# Patient Record
Sex: Female | Born: 1965 | Race: White | Hispanic: No | Marital: Single | Smoking: Never smoker
Health system: Southern US, Community
[De-identification: ages and names within clinical notes are randomized; demographics above are authoritative.]

## PROBLEM LIST (undated history)

## (undated) DIAGNOSIS — N920 Excessive and frequent menstruation with regular cycle: Secondary | ICD-10-CM

## (undated) DIAGNOSIS — L409 Psoriasis, unspecified: Secondary | ICD-10-CM

## (undated) DIAGNOSIS — I340 Nonrheumatic mitral (valve) insufficiency: Secondary | ICD-10-CM

## (undated) DIAGNOSIS — G4700 Insomnia, unspecified: Secondary | ICD-10-CM

## (undated) DIAGNOSIS — I519 Heart disease, unspecified: Secondary | ICD-10-CM

## (undated) DIAGNOSIS — B069 Rubella without complication: Secondary | ICD-10-CM

## (undated) DIAGNOSIS — R1310 Dysphagia, unspecified: Secondary | ICD-10-CM

## (undated) DIAGNOSIS — I341 Nonrheumatic mitral (valve) prolapse: Secondary | ICD-10-CM

## (undated) DIAGNOSIS — R569 Unspecified convulsions: Secondary | ICD-10-CM

## (undated) DIAGNOSIS — Q02 Microcephaly: Secondary | ICD-10-CM

## (undated) DIAGNOSIS — K5909 Other constipation: Secondary | ICD-10-CM

## (undated) DIAGNOSIS — E079 Disorder of thyroid, unspecified: Secondary | ICD-10-CM

## (undated) DIAGNOSIS — F73 Profound intellectual disabilities: Secondary | ICD-10-CM

## (undated) DIAGNOSIS — G8101 Flaccid hemiplegia affecting right dominant side: Secondary | ICD-10-CM

## (undated) DIAGNOSIS — L709 Acne, unspecified: Secondary | ICD-10-CM

## (undated) HISTORY — PX: THYROID SURGERY: SHX805

---

## 1999-09-19 ENCOUNTER — Emergency Department (HOSPITAL_COMMUNITY): Admission: EM | Admit: 1999-09-19 | Discharge: 1999-09-20 | Payer: Self-pay | Admitting: Emergency Medicine

## 1999-09-19 ENCOUNTER — Ambulatory Visit (HOSPITAL_COMMUNITY): Admission: RE | Admit: 1999-09-19 | Discharge: 1999-09-19 | Payer: Self-pay | Admitting: Family Medicine

## 1999-09-19 ENCOUNTER — Encounter: Payer: Self-pay | Admitting: Family Medicine

## 1999-10-23 ENCOUNTER — Ambulatory Visit (HOSPITAL_COMMUNITY): Admission: RE | Admit: 1999-10-23 | Discharge: 1999-10-23 | Payer: Self-pay | Admitting: Family Medicine

## 1999-10-23 ENCOUNTER — Encounter: Payer: Self-pay | Admitting: Family Medicine

## 2001-02-04 ENCOUNTER — Encounter: Admission: RE | Admit: 2001-02-04 | Discharge: 2001-05-05 | Payer: Self-pay | Admitting: Family Medicine

## 2001-07-30 ENCOUNTER — Encounter: Payer: Self-pay | Admitting: Emergency Medicine

## 2001-07-30 ENCOUNTER — Emergency Department (HOSPITAL_COMMUNITY): Admission: EM | Admit: 2001-07-30 | Discharge: 2001-07-30 | Payer: Self-pay | Admitting: Emergency Medicine

## 2001-10-23 ENCOUNTER — Observation Stay (HOSPITAL_COMMUNITY): Admission: EM | Admit: 2001-10-23 | Discharge: 2001-10-25 | Payer: Self-pay | Admitting: Emergency Medicine

## 2002-02-06 ENCOUNTER — Encounter: Payer: Self-pay | Admitting: Specialist

## 2002-02-06 ENCOUNTER — Ambulatory Visit (HOSPITAL_COMMUNITY): Admission: RE | Admit: 2002-02-06 | Discharge: 2002-02-06 | Payer: Self-pay | Admitting: Specialist

## 2002-07-07 ENCOUNTER — Emergency Department (HOSPITAL_COMMUNITY): Admission: EM | Admit: 2002-07-07 | Discharge: 2002-07-07 | Payer: Self-pay | Admitting: Emergency Medicine

## 2003-03-04 ENCOUNTER — Emergency Department (HOSPITAL_COMMUNITY): Admission: EM | Admit: 2003-03-04 | Discharge: 2003-03-04 | Payer: Self-pay | Admitting: Emergency Medicine

## 2004-02-17 ENCOUNTER — Emergency Department (HOSPITAL_COMMUNITY): Admission: EM | Admit: 2004-02-17 | Discharge: 2004-02-17 | Payer: Self-pay | Admitting: Emergency Medicine

## 2004-05-03 ENCOUNTER — Emergency Department (HOSPITAL_COMMUNITY): Admission: EM | Admit: 2004-05-03 | Discharge: 2004-05-03 | Payer: Self-pay | Admitting: Emergency Medicine

## 2004-06-08 ENCOUNTER — Emergency Department (HOSPITAL_COMMUNITY): Admission: EM | Admit: 2004-06-08 | Discharge: 2004-06-08 | Payer: Self-pay | Admitting: Emergency Medicine

## 2004-06-20 ENCOUNTER — Encounter (INDEPENDENT_AMBULATORY_CARE_PROVIDER_SITE_OTHER): Payer: Self-pay | Admitting: Specialist

## 2004-06-20 ENCOUNTER — Other Ambulatory Visit: Admission: RE | Admit: 2004-06-20 | Discharge: 2004-06-20 | Payer: Self-pay | Admitting: Interventional Radiology

## 2004-06-20 ENCOUNTER — Encounter: Admission: RE | Admit: 2004-06-20 | Discharge: 2004-06-20 | Payer: Self-pay | Admitting: Specialist

## 2004-07-17 ENCOUNTER — Emergency Department (HOSPITAL_COMMUNITY): Admission: EM | Admit: 2004-07-17 | Discharge: 2004-07-18 | Payer: Self-pay | Admitting: Emergency Medicine

## 2004-08-01 ENCOUNTER — Ambulatory Visit (HOSPITAL_COMMUNITY): Admission: RE | Admit: 2004-08-01 | Discharge: 2004-08-01 | Payer: Self-pay | Admitting: Specialist

## 2004-08-01 ENCOUNTER — Encounter (INDEPENDENT_AMBULATORY_CARE_PROVIDER_SITE_OTHER): Payer: Self-pay | Admitting: Cardiology

## 2004-08-03 ENCOUNTER — Ambulatory Visit (HOSPITAL_COMMUNITY): Admission: RE | Admit: 2004-08-03 | Discharge: 2004-08-03 | Payer: Self-pay | Admitting: Otolaryngology

## 2005-08-30 ENCOUNTER — Emergency Department (HOSPITAL_COMMUNITY): Admission: EM | Admit: 2005-08-30 | Discharge: 2005-08-30 | Payer: Self-pay | Admitting: Emergency Medicine

## 2007-06-05 ENCOUNTER — Ambulatory Visit (HOSPITAL_COMMUNITY): Admission: RE | Admit: 2007-06-05 | Discharge: 2007-06-05 | Payer: Self-pay | Admitting: Gastroenterology

## 2007-10-22 ENCOUNTER — Ambulatory Visit: Payer: Self-pay | Admitting: Obstetrics and Gynecology

## 2007-11-10 ENCOUNTER — Emergency Department (HOSPITAL_BASED_OUTPATIENT_CLINIC_OR_DEPARTMENT_OTHER): Admission: EM | Admit: 2007-11-10 | Discharge: 2007-11-10 | Payer: Self-pay | Admitting: Emergency Medicine

## 2008-06-15 ENCOUNTER — Emergency Department (HOSPITAL_BASED_OUTPATIENT_CLINIC_OR_DEPARTMENT_OTHER): Admission: EM | Admit: 2008-06-15 | Discharge: 2008-06-15 | Payer: Self-pay | Admitting: Emergency Medicine

## 2009-02-14 ENCOUNTER — Emergency Department (HOSPITAL_BASED_OUTPATIENT_CLINIC_OR_DEPARTMENT_OTHER): Admission: EM | Admit: 2009-02-14 | Discharge: 2009-02-14 | Payer: Self-pay | Admitting: Emergency Medicine

## 2009-05-22 ENCOUNTER — Emergency Department (HOSPITAL_BASED_OUTPATIENT_CLINIC_OR_DEPARTMENT_OTHER): Admission: EM | Admit: 2009-05-22 | Discharge: 2009-05-22 | Payer: Self-pay | Admitting: Emergency Medicine

## 2009-05-22 ENCOUNTER — Ambulatory Visit: Payer: Self-pay | Admitting: Diagnostic Radiology

## 2010-05-07 ENCOUNTER — Encounter: Payer: Self-pay | Admitting: Specialist

## 2010-05-07 ENCOUNTER — Encounter: Payer: Self-pay | Admitting: Gastroenterology

## 2010-05-09 ENCOUNTER — Emergency Department (HOSPITAL_BASED_OUTPATIENT_CLINIC_OR_DEPARTMENT_OTHER)
Admission: EM | Admit: 2010-05-09 | Discharge: 2010-05-09 | Payer: Self-pay | Source: Home / Self Care | Admitting: Emergency Medicine

## 2010-05-15 ENCOUNTER — Emergency Department (HOSPITAL_BASED_OUTPATIENT_CLINIC_OR_DEPARTMENT_OTHER)
Admission: EM | Admit: 2010-05-15 | Discharge: 2010-05-15 | Payer: Self-pay | Source: Home / Self Care | Admitting: Emergency Medicine

## 2010-07-19 LAB — URINALYSIS, ROUTINE W REFLEX MICROSCOPIC
Bilirubin Urine: NEGATIVE
Glucose, UA: NEGATIVE mg/dL
Hgb urine dipstick: NEGATIVE
Nitrite: NEGATIVE
Specific Gravity, Urine: 1.014 (ref 1.005–1.030)
Urobilinogen, UA: 0.2 mg/dL (ref 0.0–1.0)

## 2010-07-19 LAB — URINE CULTURE
Colony Count: NO GROWTH
Culture: NO GROWTH

## 2010-07-19 LAB — BASIC METABOLIC PANEL
Calcium: 9.5 mg/dL (ref 8.4–10.5)
GFR calc Af Amer: >60 mL/min (ref >60)
Sodium: 143 mEq/L (ref 135–145)

## 2010-07-19 LAB — POCT TOXICOLOGY PANEL

## 2010-07-19 LAB — ACETAMINOPHEN LEVEL: Acetaminophen (Tylenol), Serum: <10 ug/mL — ABNORMAL LOW (ref 10–30)

## 2010-07-19 LAB — DIFFERENTIAL
Basophils Relative: 0 % (ref 0–1)
Lymphs Abs: 1.2 10*3/uL (ref 0.7–4.0)
Monocytes Absolute: 0.6 10*3/uL (ref 0.1–1.0)
Monocytes Relative: 10 % (ref 3–12)
Neutro Abs: 3.5 10*3/uL (ref 1.7–7.7)

## 2010-07-19 LAB — CBC
Hemoglobin: 15.9 g/dL — ABNORMAL HIGH (ref 12.0–15.0)
MCHC: 34.6 g/dL (ref 30.0–36.0)
MCV: 94.4 fL (ref 78.0–100.0)
RBC: 4.88 MIL/uL (ref 3.87–5.11)
WBC: 5.3 10*3/uL (ref 4.0–10.5)

## 2010-07-27 LAB — DIFFERENTIAL
Basophils Relative: 1 % (ref 0–1)
Eosinophils Relative: 3 % (ref 0–5)
Lymphs Abs: 3 10*3/uL (ref 0.7–4.0)
Monocytes Absolute: 1 10*3/uL (ref 0.1–1.0)
Monocytes Relative: 14 % — ABNORMAL HIGH (ref 3–12)
Neutro Abs: 3.4 10*3/uL (ref 1.7–7.7)
Neutrophils Relative %: 43 % (ref 43–77)

## 2010-07-27 LAB — CBC
HCT: 43.2 % (ref 36.0–46.0)
MCV: 94.8 fL (ref 78.0–100.0)
Platelets: 353 10*3/uL (ref 150–400)
WBC: 7.6 10*3/uL (ref 4.0–10.5)

## 2010-07-27 LAB — BASIC METABOLIC PANEL
CO2: 28 mEq/L (ref 19–32)
Calcium: 8.7 mg/dL (ref 8.4–10.5)
Chloride: 107 mEq/L (ref 96–112)
GFR calc Af Amer: >60 mL/min (ref >60)
Glucose, Bld: 72 mg/dL (ref 70–99)
Potassium: 3.9 mEq/L (ref 3.5–5.1)

## 2010-08-04 ENCOUNTER — Emergency Department (HOSPITAL_BASED_OUTPATIENT_CLINIC_OR_DEPARTMENT_OTHER)
Admission: EM | Admit: 2010-08-04 | Discharge: 2010-08-04 | Disposition: A | Discharge status: Home / Self Care | Payer: Medicare Other | Attending: Emergency Medicine | Admitting: Emergency Medicine

## 2010-08-04 DIAGNOSIS — H109 Unspecified conjunctivitis: Secondary | ICD-10-CM | POA: Insufficient documentation

## 2010-08-04 DIAGNOSIS — Z79899 Other long term (current) drug therapy: Secondary | ICD-10-CM | POA: Insufficient documentation

## 2010-08-04 DIAGNOSIS — H5789 Other specified disorders of eye and adnexa: Secondary | ICD-10-CM | POA: Insufficient documentation

## 2011-10-07 ENCOUNTER — Encounter (HOSPITAL_BASED_OUTPATIENT_CLINIC_OR_DEPARTMENT_OTHER): Payer: Self-pay | Admitting: *Deleted

## 2011-10-07 ENCOUNTER — Emergency Department (HOSPITAL_BASED_OUTPATIENT_CLINIC_OR_DEPARTMENT_OTHER)
Admission: EM | Admit: 2011-10-07 | Discharge: 2011-10-07 | Disposition: A | Discharge status: Home / Self Care | Payer: Medicare Other | Attending: Emergency Medicine | Admitting: Emergency Medicine

## 2011-10-07 DIAGNOSIS — G40401 Other generalized epilepsy and epileptic syndromes, not intractable, with status epilepticus: Secondary | ICD-10-CM | POA: Insufficient documentation

## 2011-10-07 DIAGNOSIS — Z79899 Other long term (current) drug therapy: Secondary | ICD-10-CM | POA: Insufficient documentation

## 2011-10-07 DIAGNOSIS — G40901 Epilepsy, unspecified, not intractable, with status epilepticus: Secondary | ICD-10-CM

## 2011-10-07 DIAGNOSIS — F79 Unspecified intellectual disabilities: Secondary | ICD-10-CM | POA: Insufficient documentation

## 2011-10-07 DIAGNOSIS — Z8673 Personal history of transient ischemic attack (TIA), and cerebral infarction without residual deficits: Secondary | ICD-10-CM | POA: Insufficient documentation

## 2011-10-07 HISTORY — DX: Unspecified convulsions: R56.9

## 2011-10-07 HISTORY — DX: Profound intellectual disabilities: F73

## 2011-10-07 HISTORY — DX: Disorder of thyroid, unspecified: E07.9

## 2011-10-07 HISTORY — DX: Heart disease, unspecified: I51.9

## 2011-10-07 LAB — URINALYSIS, ROUTINE W REFLEX MICROSCOPIC
Glucose, UA: NEGATIVE mg/dL
Hgb urine dipstick: NEGATIVE
Leukocytes, UA: NEGATIVE
Protein, ur: NEGATIVE mg/dL
Specific Gravity, Urine: 1.016 (ref 1.005–1.030)
Urobilinogen, UA: 0.2 mg/dL (ref 0.0–1.0)

## 2011-10-07 LAB — BASIC METABOLIC PANEL
BUN: 16 mg/dL (ref 6–23)
Calcium: 9.2 mg/dL (ref 8.4–10.5)
Chloride: 97 mEq/L (ref 96–112)
GFR calc Af Amer: >90 mL/min (ref >90)
GFR calc non Af Amer: >90 mL/min (ref >90)
Glucose, Bld: 111 mg/dL — ABNORMAL HIGH (ref 70–99)

## 2011-10-07 LAB — GLUCOSE, CAPILLARY: Glucose-Capillary: 53 mg/dL — ABNORMAL LOW (ref 70–99)

## 2011-10-07 LAB — DIFFERENTIAL
Basophils Relative: 0 % (ref 0–1)
Eosinophils Absolute: 0.1 10*3/uL (ref 0.0–0.7)
Lymphocytes Relative: 17 % (ref 12–46)
Monocytes Absolute: 1 10*3/uL (ref 0.1–1.0)
Neutro Abs: 8.7 10*3/uL — ABNORMAL HIGH (ref 1.7–7.7)
Neutrophils Relative %: 74 % (ref 43–77)

## 2011-10-07 LAB — CBC
Hemoglobin: 13.2 g/dL (ref 12.0–15.0)
MCH: 30.9 pg (ref 26.0–34.0)
MCHC: 34.5 g/dL (ref 30.0–36.0)
RDW: 14.2 % (ref 11.5–15.5)
WBC: 11.7 10*3/uL — ABNORMAL HIGH (ref 4.0–10.5)

## 2011-10-07 MED ORDER — DEXTROSE 50 % IV SOLN
1.0000 | Freq: Once | INTRAVENOUS | Status: AC
  Administered 2011-10-07: 50 mL via INTRAVENOUS
  Filled 2011-10-07: qty 50

## 2011-10-07 MED ORDER — SODIUM CHLORIDE 0.9 % IV BOLUS (SEPSIS)
1000.0000 mL | Freq: Once | INTRAVENOUS | Status: AC
  Administered 2011-10-07: 1000 mL via INTRAVENOUS

## 2011-10-07 NOTE — ED Notes (Signed)
Pt to ed #4 via ems from group home with seizures x3 (grand mal) since this am 2 focal seizures witnessed . 2 .5 versed via para

## 2011-10-07 NOTE — ED Provider Notes (Addendum)
History     CSN: [id number]  Arrival date & time 10/07/11  0935   First MD Initiated Contact with Patient 10/07/11 [phone]      No chief complaint on file.   (Consider location/radiation/quality/duration/timing/severity/associated sxs/prior treatment) HPI - Level 5 caveat due to mental status Pt brought to the ED via EMS from local group home. She has a history of mental retardation, seizures and stroke. She has had a sore throat recently and has been intermittently refusing her medications including antiepileptics. She had several seizures this morning, although this is not unusual for her. She was given valium enroute by EMS and is somnolent on arrival.   No past medical history on file.  No past surgical history on file.  No family history on file.  History  Substance Use Topics  . Smoking status: Not on file  . Smokeless tobacco: Not on file  . Alcohol Use: Not on file    OB History    No data available      Review of Systems Unable to assess due to mental status.   Allergies  Review of patient's allergies indicates not on file.  Home Medications   Current Outpatient Rx  Name Route Sig Dispense Refill  . ZYRTEC PO Oral Take by mouth.    . CLONAZEPAM PO Oral Take by mouth.    . DECADRON PO Oral Take by mouth.    Marland Kitchen VALIUM PO Oral Take by mouth.    Marland Kitchen FLUTICASONE PROPIONATE 50 MCG/ACT NA SUSP      . FOLIC ACID PO Oral Take by mouth.    Marland Kitchen LAMICTAL PO Oral Take by mouth.    . ATIVAN PO Oral Take by mouth.    . METHOTREXATE PO Oral Take by mouth.    . NORTREL 7/7/7 PO Oral Take by mouth.    . AMBIEN PO Oral Take by mouth.      BP 100/75  Pulse 83  Resp 18  SpO2 96%  Physical Exam  Nursing note and vitals reviewed. Constitutional: She appears well-nourished.  HENT:  Head: Normocephalic and atraumatic.  Eyes: Pupils are equal, round, and reactive to light. Right eye exhibits no discharge. Left eye exhibits no discharge.  Neck: Normal range of motion. Neck  supple.  Cardiovascular: Normal rate, regular rhythm and intact distal pulses.   Murmur heard. Pulmonary/Chest: Effort normal and breath sounds normal. She has no wheezes. She has no rales.  Abdominal: Bowel sounds are normal. She exhibits no distension. There is no tenderness.  Musculoskeletal: Normal range of motion. She exhibits no edema.  Neurological:       Unable to assess due to mental status  Skin: Skin is warm and dry. No rash noted.  Psychiatric:       Unable to assess due to mental status    ED Course  Procedures (including critical care time)  Labs Reviewed  CBC - Abnormal; Notable for the following:    WBC 11.7 (*)     Platelets 419 (*)     All other components within normal limits  DIFFERENTIAL - Abnormal; Notable for the following:    Neutro Abs 8.7 (*)     All other components within normal limits  BASIC METABOLIC PANEL - Abnormal; Notable for the following:    Glucose, Bld 111 (*)     All other components within normal limits  URINALYSIS, ROUTINE W REFLEX MICROSCOPIC - Abnormal; Notable for the following:    APPearance CLOUDY (*)  All other components within normal limits  GLUCOSE, CAPILLARY - Abnormal; Notable for the following:    Glucose-Capillary 53 (*)     All other components within normal limits  PREGNANCY, URINE  URINE CULTURE   No results found.   No diagnosis found.    MDM  Care giver at bedside states she is usually able to get the patient to take her medications but that other staff are less successful. Pt had borderline low CBG on arrival given IV D50 with improvement. Caregiver is comfortable with the patient returning to Group Home.      Date: 10/07/2011  Rate: 75  Rhythm: normal sinus rhythm  QRS Axis: right  Intervals: normal  ST/T Wave abnormalities: normal  Conduction Disutrbances:iRBBB  Narrative Interpretation:   Old EKG Reviewed: unchanged   12:10 PM Caregiver now states the nurse from Group Home would like for me  to discuss this case with their supervising provider who is supposed to call me back soon. Discussed with Dr. August Saucer who is the patient's neurologist and would like for the patient to be admitted to her service at [facility]. Awaiting bed assignment.   Monterius Rolf B. Bernette Mayers, MD 10/07/11 1610

## 2011-10-07 NOTE — ED Notes (Signed)
Glucose 53 by accucheck

## 2011-10-07 NOTE — ED Notes (Signed)
Arterial puncture for lab draw performed to left radial artery.  Positive Allen's test prior to draw.  Pressure for 5 minutes.

## 2011-10-09 LAB — URINE CULTURE: Culture: NO GROWTH

## 2012-04-29 ENCOUNTER — Ambulatory Visit: Payer: Medicare Other | Admitting: Rehabilitation

## 2012-04-30 ENCOUNTER — Ambulatory Visit: Payer: Medicare Other | Attending: Specialist | Admitting: Rehabilitation

## 2012-04-30 DIAGNOSIS — IMO0001 Reserved for inherently not codable concepts without codable children: Secondary | ICD-10-CM | POA: Insufficient documentation

## 2012-04-30 DIAGNOSIS — R262 Difficulty in walking, not elsewhere classified: Secondary | ICD-10-CM | POA: Insufficient documentation

## 2013-06-12 ENCOUNTER — Ambulatory Visit: Payer: Medicare Other

## 2013-06-15 ENCOUNTER — Ambulatory Visit: Payer: Medicare Other | Attending: Family Medicine | Admitting: Physical Therapy

## 2013-06-15 DIAGNOSIS — IMO0001 Reserved for inherently not codable concepts without codable children: Secondary | ICD-10-CM | POA: Insufficient documentation

## 2013-06-15 DIAGNOSIS — G8190 Hemiplegia, unspecified affecting unspecified side: Secondary | ICD-10-CM | POA: Insufficient documentation

## 2013-06-15 DIAGNOSIS — R262 Difficulty in walking, not elsewhere classified: Secondary | ICD-10-CM | POA: Insufficient documentation

## 2013-06-23 ENCOUNTER — Ambulatory Visit: Payer: Medicare Other | Admitting: Physical Therapy

## 2013-07-11 ENCOUNTER — Emergency Department (HOSPITAL_BASED_OUTPATIENT_CLINIC_OR_DEPARTMENT_OTHER)
Admission: EM | Admit: 2013-07-11 | Discharge: 2013-07-11 | Disposition: A | Discharge status: Home / Self Care | Payer: Medicare Other | Attending: Emergency Medicine | Admitting: Emergency Medicine

## 2013-07-11 ENCOUNTER — Encounter (HOSPITAL_BASED_OUTPATIENT_CLINIC_OR_DEPARTMENT_OTHER): Payer: Self-pay | Admitting: Emergency Medicine

## 2013-07-11 DIAGNOSIS — Z043 Encounter for examination and observation following other accident: Secondary | ICD-10-CM | POA: Insufficient documentation

## 2013-07-11 DIAGNOSIS — W19XXXA Unspecified fall, initial encounter: Secondary | ICD-10-CM

## 2013-07-11 DIAGNOSIS — G40909 Epilepsy, unspecified, not intractable, without status epilepticus: Secondary | ICD-10-CM | POA: Insufficient documentation

## 2013-07-11 DIAGNOSIS — IMO0002 Reserved for concepts with insufficient information to code with codable children: Secondary | ICD-10-CM | POA: Insufficient documentation

## 2013-07-11 DIAGNOSIS — I519 Heart disease, unspecified: Secondary | ICD-10-CM | POA: Insufficient documentation

## 2013-07-11 DIAGNOSIS — Y9210 Unspecified residential institution as the place of occurrence of the external cause: Secondary | ICD-10-CM | POA: Insufficient documentation

## 2013-07-11 DIAGNOSIS — Z8619 Personal history of other infectious and parasitic diseases: Secondary | ICD-10-CM | POA: Insufficient documentation

## 2013-07-11 DIAGNOSIS — F73 Profound intellectual disabilities: Secondary | ICD-10-CM | POA: Insufficient documentation

## 2013-07-11 DIAGNOSIS — Z79899 Other long term (current) drug therapy: Secondary | ICD-10-CM | POA: Insufficient documentation

## 2013-07-11 DIAGNOSIS — Y9389 Activity, other specified: Secondary | ICD-10-CM | POA: Insufficient documentation

## 2013-07-11 DIAGNOSIS — W06XXXA Fall from bed, initial encounter: Secondary | ICD-10-CM | POA: Insufficient documentation

## 2013-07-11 NOTE — ED Notes (Signed)
Per guardian patient has the ability to indicate pain when touched if she is hurting, no abrasions

## 2013-07-11 NOTE — ED Notes (Signed)
Per Child psychotherapistsocial worker, patient was found on the floor beside her bed and had rolled out of bed.

## 2013-07-11 NOTE — ED Provider Notes (Signed)
CSN: [id number]     Arrival date & time 07/11/13  2536 History   First MD Initiated Contact with Patient 07/11/13 [phone]     Chief Complaint  Patient presents with  . Fall     (Consider location/radiation/quality/duration/timing/severity/associated sxs/prior Treatment) Patient is a 48 y.o. female presenting with fall. The history is provided by a caregiver.  Fall   patient here after having an unwitnessed fall at the group home. 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 She fell from a bed approximately 3 feet above the floor onto a carpeted surface. According to the group home staff member, she is at her neurological baseline. Patient is able to communicate when she is in pain and staff member says that she has not done this. She is at her baseline state of health currently.  Past Medical History  Diagnosis Date  . Seizures   . Thyroid disease   . Profoundly mentally retarded   . Heart disease     rubella   Past Surgical History  Procedure Laterality Date  . Thyroid surgery     No family history on file. History  Substance Use Topics  . Smoking status: Never Smoker   . Smokeless tobacco: Not on file  . Alcohol Use: No   OB History   Grav Para Term Preterm Abortions TAB SAB Ect Mult Living                 Review of Systems  All other systems reviewed and are negative.      Allergies  Review of patient's allergies indicates no known allergies.  Home Medications   Current Outpatient Rx  Name  Route  Sig  Dispense  Refill  . cetirizine (ZYRTEC) 10 MG tablet   Oral   Take 10 mg by mouth daily. Patient is given this medication for her allergies.         . clindamycin (CLEOCIN) 300 MG capsule   Oral   Take 300 mg by mouth 3 (three) times daily.         . clobetasol cream (TEMOVATE) 0.05 %   Topical   Apply 1 application topically 2 (two) times daily.         . clonazePAM (KLONOPIN) 2 MG tablet    Oral   Take 2 mg by mouth 2 (two) times daily as needed.         Marland Kitchen dexamethasone (DECADRON) 4 MG tablet   Oral   Take 4 mg by mouth 2 (two) times daily with a meal.         . folic acid (FOLVITE) 1 MG tablet   Oral   Take 1 mg by mouth daily.         Marland Kitchen lamoTRIgine (LAMICTAL) 25 MG tablet   Oral   Take 25 mg by mouth daily. Anti-seizure medication.  Consult needed.         . levETIRAcetam (KEPPRA) 750 MG tablet   Oral   Take 750 mg by mouth every 12 (twelve) hours. Patient is given 1500 milligrams of this medication at 8 am and 8 pm.         . levothyroxine (SYNTHROID, LEVOTHROID) 125 MCG tablet   Oral   Take 125 mcg by mouth daily.         . methotrexate (RHEUMATREX) 2.5 MG tablet   Oral   Take 2.5 mg by mouth once a week. Caution:Chemotherapy. Protect from light. Patient  is given this medication on Tuesdays.         . methotrexate (RHEUMATREX) 7.5 MG tablet   Oral   Take 7.5 mg by mouth once a week. Caution" Chemotherapy. Protect from light. Patient is given this medication on Mondays.         . norethindrone-ethinyl estradiol (TRIPHASIL,CYCLAFEM,ALYACEN) 0.5/0.75/1-35 MG-MCG tablet   Oral   Take 1 tablet by mouth daily.         . polyethylene glycol (MIRALAX / GLYCOLAX) packet   Oral   Take 17 g by mouth daily. Patient is given this medication for constipation.         . potassium chloride SA (K-DUR,KLOR-CON) 20 MEQ tablet   Oral   Take 20 mEq by mouth 2 (two) times daily.         Marland Kitchen. zolpidem (AMBIEN) 10 MG tablet   Oral   Take 10 mg by mouth at bedtime as needed.          BP 90/55  Pulse 60  Temp(Src) 98.4 F (36.9 C) (Oral)  Resp 24  SpO2 100% Physical Exam  Nursing note and vitals reviewed. Constitutional: She appears well-developed and well-nourished.  Non-toxic appearance. No distress.  HENT:  Head: Normocephalic and atraumatic.  Eyes: Conjunctivae, EOM and lids are normal. Pupils are equal, round, and reactive to light.  Neck:  Normal range of motion. Neck supple. No tracheal deviation present. No mass present.  Cardiovascular: Normal rate, regular rhythm and normal heart sounds.  Exam reveals no gallop.   No murmur heard. Pulmonary/Chest: Effort normal and breath sounds normal. No stridor. No respiratory distress. She has no decreased breath sounds. She has no wheezes. She has no rhonchi. She has no rales.  Abdominal: Soft. Normal appearance and bowel sounds are normal. She exhibits no distension. There is no tenderness. There is no rebound and no CVA tenderness.  Musculoskeletal: Normal range of motion. She exhibits no edema and no tenderness.  Neurological: She is alert. No cranial nerve deficit. GCS eye subscore is 4. GCS verbal subscore is 5. GCS motor subscore is 6.  No changes noted from baseline according to staff members  Skin: Skin is warm and dry. No abrasion and no rash noted.  Psychiatric: Her affect is blunt. She is slowed.  All behavior at baseline according to staff members    ED Course  Procedures (including critical care time) Labs Review Labs Reviewed - No data to display Imaging Review No results found.   EKG Interpretation None      MDM   Final diagnoses:  None    Patient without visible signs of trauma at this time. Have palpated her extremities as well as her chest and abdomen and she is nontender. She's also nontender along her cervical thoracic and lumbar spine. Blood pressure noted and likely her baseline given her condition. No indication for imaging at this time. Case discussed with group home staff member and patient stable for discharge    Toy BakerAnthony T Raoul Ciano, MD 07/11/13 0800

## 2013-07-11 NOTE — Discharge Instructions (Signed)

## 2016-03-15 ENCOUNTER — Encounter (HOSPITAL_COMMUNITY): Payer: Self-pay | Admitting: Vascular Surgery

## 2016-03-15 ENCOUNTER — Emergency Department (HOSPITAL_COMMUNITY)
Admission: EM | Admit: 2016-03-15 | Discharge: 2016-03-15 | Disposition: A | Discharge status: Home / Self Care | Payer: Medicare Other | Attending: Emergency Medicine | Admitting: Emergency Medicine

## 2016-03-15 DIAGNOSIS — R1110 Vomiting, unspecified: Secondary | ICD-10-CM | POA: Insufficient documentation

## 2016-03-15 HISTORY — DX: Flaccid hemiplegia affecting right dominant side: G81.01

## 2016-03-15 HISTORY — DX: Excessive and frequent menstruation with regular cycle: N92.0

## 2016-03-15 HISTORY — DX: Unspecified convulsions: R56.9

## 2016-03-15 HISTORY — DX: Rubella without complication: B06.9

## 2016-03-15 HISTORY — DX: Nonrheumatic mitral (valve) insufficiency: I34.0

## 2016-03-15 HISTORY — DX: Insomnia, unspecified: G47.00

## 2016-03-15 HISTORY — DX: Acne, unspecified: L70.9

## 2016-03-15 HISTORY — DX: Dysphagia, unspecified: R13.10

## 2016-03-15 HISTORY — DX: Other constipation: K59.09

## 2016-03-15 HISTORY — DX: Microcephaly: Q02

## 2016-03-15 HISTORY — DX: Nonrheumatic mitral (valve) prolapse: I34.1

## 2016-03-15 HISTORY — DX: Psoriasis, unspecified: L40.9

## 2016-03-15 MED ORDER — PROMETHAZINE HCL 25 MG PO TABS
25.0000 mg | ORAL_TABLET | Freq: Once | ORAL | Status: AC
  Administered 2016-03-15: 25 mg via ORAL
  Filled 2016-03-15: qty 1

## 2016-03-15 MED ORDER — PROMETHAZINE HCL 25 MG PO TABS: 25.0000 mg | ORAL_TABLET | Freq: Three times a day (TID) | ORAL | 0 refills | Status: AC | PRN

## 2016-03-15 NOTE — ED Triage Notes (Signed)
Pt reports to the ED for eval of N/V that began today. Pt was seen at [facility] on Monday for multiple seizures. She had lab work done and was given ativan and had her medications increased and today she developed N/V possibly attributed to medication rxn? Caregiver requesting that blood results be obtained from [facility] instead of having her be stuck again.

## 2016-03-15 NOTE — ED Provider Notes (Signed)
[facility]-EMERGENCY DEPT Provider Note   CSN: [id number] Arrival date & time: 03/15/16  1735     History   Chief Complaint Chief Complaint  Patient presents with  . Emesis    HPI Megan Rasmussen is a 50 y.o. female.  Level V caveat for severe mental retardation secondary to microcephaly. History obtained from caregiver. Patient probably had 3-4 episodes of vomitus today. Patient was seen at [facility] on Monday for increased seizure frequency.  Apparently her Vimpat was increased at that time, but the caregiver is uncertain of the dose.  No fever, sweats, chills, changes in behavior      Past Medical History:  Diagnosis Date  . Acne   . Chronic constipation   . Dysphagia   . Heart disease    rubella  . Insomnia   . Menorrhagia   . Microcephalic (HCC)   . Mitral prolapse   . Mitral regurgitation   . Profoundly mentally retarded   . Psoriasis   . Right flaccid hemiplegia (HCC)   . Rubella   . Seizure (HCC)   . Seizures (HCC)   . Thyroid disease     There are no active problems to display for this patient.   Past Surgical History:  Procedure Laterality Date  . THYROID SURGERY      OB History    No data available       Home Medications    Prior to Admission medications   Medication Sig Start Date End Date Taking? Authorizing Provider  cetirizine (ZYRTEC) 10 MG tablet Take 10 mg by mouth daily. Patient is given this medication for her allergies.    Historical Provider, MD  clindamycin (CLEOCIN) 300 MG capsule Take 300 mg by mouth 3 (three) times daily.    Historical Provider, MD  clobetasol cream (TEMOVATE) 0.05 % Apply 1 application topically 2 (two) times daily.    Historical Provider, MD  clonazePAM (KLONOPIN) 2 MG tablet Take 2 mg by mouth 2 (two) times daily as needed.    Historical Provider, MD  dexamethasone (DECADRON) 4 MG tablet Take 4 mg by mouth 2 (two) times daily with a meal.    Historical Provider, MD  folic acid (FOLVITE) 1  MG tablet Take 1 mg by mouth daily.    Historical Provider, MD  lamoTRIgine (LAMICTAL) 25 MG tablet Take 25 mg by mouth daily. Anti-seizure medication.  Consult needed.    Historical Provider, MD  levETIRAcetam (KEPPRA) 750 MG tablet Take 750 mg by mouth every 12 (twelve) hours. Patient is given 1500 milligrams of this medication at 8 am and 8 pm.    Historical Provider, MD  levothyroxine (SYNTHROID, LEVOTHROID) 125 MCG tablet Take 125 mcg by mouth daily.    Historical Provider, MD  methotrexate (RHEUMATREX) 2.5 MG tablet Take 2.5 mg by mouth once a week. Caution:Chemotherapy. Protect from light. Patient is given this medication on Tuesdays.    Historical Provider, MD  methotrexate (RHEUMATREX) 7.5 MG tablet Take 7.5 mg by mouth once a week. Caution" Chemotherapy. Protect from light. Patient is given this medication on Mondays.    Historical Provider, MD  norethindrone-ethinyl estradiol (TRIPHASIL,CYCLAFEM,ALYACEN) 0.5/0.75/1-35 MG-MCG tablet Take 1 tablet by mouth daily.    Historical Provider, MD  polyethylene glycol (MIRALAX / GLYCOLAX) packet Take 17 g by mouth daily. Patient is given this medication for constipation.    Historical Provider, MD  potassium chloride SA (K-DUR,KLOR-CON) 20 MEQ tablet Take 20 mEq by mouth 2 (two) times daily.  Historical Provider, MD  promethazine (PHENERGAN) 25 MG tablet Take 1 tablet (25 mg total) by mouth every 8 (eight) hours as needed for nausea or vomiting. 03/15/16   Donnetta HutchingBrian Dove Gresham, MD  zolpidem (AMBIEN) 10 MG tablet Take 10 mg by mouth at bedtime as needed.    Historical Provider, MD    Family History No family history on file.  Social History Social History  Substance Use Topics  . Smoking status: Never Smoker  . Smokeless tobacco: Not on file  . Alcohol use No     Allergies   Patient has no known allergies.   Review of Systems Review of Systems  All other systems reviewed and are negative.    Physical Exam Updated Vital Signs BP 105/90  (BP Location: Left Arm)   Pulse 96   Temp 97.6 F (36.4 C) (Axillary)   Resp 16   SpO2 99%   Physical Exam  Constitutional:  Severely retarded;  mucous membranes are moist  HENT:  Head: Normocephalic and atraumatic.  Eyes: Conjunctivae are normal.  Neck: Neck supple.  Cardiovascular: Normal rate and regular rhythm.   Pulmonary/Chest: Effort normal and breath sounds normal.  Abdominal: Soft. Bowel sounds are normal.  Musculoskeletal:  Unable  Neurological:  Unable  Skin: Skin is warm and dry.  Psychiatric:  Unable to communicate  Nursing note and vitals reviewed.    ED Treatments / Results  Labs (all labs ordered are listed, but only abnormal results are displayed) Labs Reviewed - No data to display  EKG  EKG Interpretation None       Radiology No results found.  Procedures Procedures (including critical care time)  Medications Ordered in ED Medications  promethazine (PHENERGAN) tablet 25 mg (25 mg Oral Given 03/15/16 1906)     Initial Impression / Assessment and Plan / ED Course  I have reviewed the triage vital signs and the nursing notes.  Pertinent labs & imaging results that were available during my care of the patient were reviewed by me and considered in my medical decision making (see chart for details).  Clinical Course    Patient does not appear dehydrated or toxic. Rx Phenergan 25 mg. Discussed with the primary caregiver of the group home.  Final Clinical Impressions(s) / ED Diagnoses   Final diagnoses:  Intractable vomiting, presence of nausea not specified, unspecified vomiting type    New Prescriptions New Prescriptions   PROMETHAZINE (PHENERGAN) 25 MG TABLET    Take 1 tablet (25 mg total) by mouth every 8 (eight) hours as needed for nausea or vomiting.     Donnetta HutchingBrian Blossom Crume, MD 03/15/16 [phone]

## 2016-03-15 NOTE — Discharge Instructions (Signed)
Prescription for nausea medication. Try to sip on clear liquids.

## 2016-03-15 NOTE — ED Notes (Signed)
Pt stable, caregiver understands discharge instructions, and reasons for return.

## 2017-07-10 ENCOUNTER — Emergency Department (HOSPITAL_COMMUNITY)
Admission: EM | Admit: 2017-07-10 | Discharge: 2017-07-10 | Disposition: A | Discharge status: Home / Self Care | Payer: Medicare Other | Attending: Emergency Medicine | Admitting: Emergency Medicine

## 2017-07-10 ENCOUNTER — Encounter (HOSPITAL_COMMUNITY): Payer: Self-pay | Admitting: Emergency Medicine

## 2017-07-10 ENCOUNTER — Other Ambulatory Visit: Payer: Self-pay

## 2017-07-10 ENCOUNTER — Emergency Department (HOSPITAL_COMMUNITY): Payer: Medicare Other

## 2017-07-10 DIAGNOSIS — K5900 Constipation, unspecified: Secondary | ICD-10-CM | POA: Insufficient documentation

## 2017-07-10 DIAGNOSIS — Z79899 Other long term (current) drug therapy: Secondary | ICD-10-CM | POA: Insufficient documentation

## 2017-07-10 MED ORDER — POLYETHYLENE GLYCOL 3350 17 GM/SCOOP PO POWD: 17.0000 g | Freq: Every day | ORAL | 0 refills | Status: AC

## 2017-07-10 NOTE — ED Triage Notes (Signed)
Per caregiver pt no BM since Monday; positive for BM post enema Monday.

## 2017-07-11 NOTE — ED Provider Notes (Signed)
[facility]-EMERGENCY DEPT Provider Note   CSN: [id number] Arrival date & time: 07/10/17  1033     History   Chief Complaint Chief Complaint  Patient presents with  . Constipation   Level 5 caveat: MR  HPI Megan Rasmussen is a 52 y.o. female.  HPI Patient is a 52 year old female with a history of profound MR, microcephaly, seizures who presents the emergency department secondary to decreased bowel movements over the past several days.  Staff members report no significant bowel movement for the past 3 days.  She was given enema on Monday with some improvement.  She intermittently struggles with constipation.  No oral medications given for constipation.  No reported vomiting or fevers.  Patient has been eating and drinking normally.   Past Medical History:  Diagnosis Date  . Acne   . Chronic constipation   . Dysphagia   . Heart disease    rubella  . Insomnia   . Menorrhagia   . Microcephalic (HCC)   . Mitral prolapse   . Mitral regurgitation   . Profoundly mentally retarded   . Psoriasis   . Right flaccid hemiplegia (HCC)   . Rubella   . Seizure (HCC)   . Seizures (HCC)   . Thyroid disease     There are no active problems to display for this patient.   Past Surgical History:  Procedure Laterality Date  . THYROID SURGERY       OB History   None      Home Medications    Prior to Admission medications   Medication Sig Start Date End Date Taking? Authorizing Provider  bisacodyl (DULCOLAX) 5 MG EC tablet Take 5 mg by mouth daily as needed for mild constipation or moderate constipation. For up to 5 days   Yes [provider]  calcitonin, salmon, (MIACALCIN/FORTICAL) 200 UNIT/ACT nasal spray Place 1 spray into the nose daily.   Yes [provider]  calcium-vitamin D (OSCAL WITH D) 500-200 MG-UNIT tablet Take 2 tablets by mouth 2 (two) times daily.   Yes [provider]  coal tar (NEUTROGENA T-GEL) 0.5 % shampoo  Apply 1 application topically at bedtime as needed. Use at bedtime to scalp 3 days a week on Monday, Wednesday and Friday   Yes [provider]  diazepam (DIASTAT) 2.5 MG GEL Place 10 mg rectally once. GIVE RECTALLY AS NEEDED FOR SEIZURES LASTING LONGER THAN 3 MINUES OR SEIZURES IN A 24 HOUR PERIOD   Yes [provider]  Emollient (AQUAPHOR EX) Apply 1 application topically daily.   Yes [provider]  folic acid (FOLVITE) 1 MG tablet Take 1 mg by mouth daily.   Yes [provider]  food thickener (THICK IT) POWD Take by mouth as needed (use for nectar thick liquids).   Yes [provider]  HUMIRA PEN 40 MG/0.8ML PNKT Inject 0.8 mLs into the skin every 14 (fourteen) days. 07/02/17  Yes [provider]  lacosamide (VIMPAT) 200 MG TABS tablet Take 200 mg by mouth 2 (two) times daily. For seizures 03/12/16  Yes [provider]  levETIRAcetam (KEPPRA) 100 MG/ML solution Take 100 mg by mouth 3 (three) times daily.   Yes [provider]  levothyroxine (SYNTHROID, LEVOTHROID) 75 MCG tablet Take 75 mcg by mouth daily before breakfast.   Yes [provider]  mirtazapine (REMERON) 15 MG tablet Take 15 mg by mouth at bedtime.   Yes [provider]  Olopatadine HCl 0.6 % SOLN  Place 2 sprays into the nose 2 (two) times daily.   Yes [provider]  PARoxetine (PAXIL) 10 MG tablet Take 10 mg by mouth at bedtime.   Yes [provider]  Valproate Sodium (VALPROIC ACID) 250 MG/5ML SOLN Take 12.5 mLs by mouth 3 (three) times daily.   Yes [provider]  Vitamins A & D (VITAMIN A & D) ointment Apply 1 application topically as needed for dry skin. TO LEFT BUTTOCK WITH EACH DEPENDS CHANGE   Yes [provider]  polyethylene glycol powder (MIRALAX) powder Take 17 g by mouth daily. 07/10/17   Azalia Bilis, MD  promethazine (PHENERGAN) 25 MG tablet Take 1 tablet (25 mg total) by mouth every 8 (eight)  hours as needed for nausea or vomiting. Patient not taking: Reported on 07/10/2017 03/15/16   Donnetta Hutching, MD    Family History No family history on file.  Social History Social History   Tobacco Use  . Smoking status: Never Smoker  Substance Use Topics  . Alcohol use: No  . Drug use: No     Allergies   Amoxicillin-pot clavulanate   Review of Systems Review of Systems  Unable to perform ROS: Patient nonverbal     Physical Exam Updated Vital Signs BP 107/78   Pulse 80   Temp (!) 97.3 F (36.3 C) (Oral)   Resp 18   SpO2 100%   Physical Exam  Constitutional: She appears well-developed and well-nourished.  HENT:  Head: Atraumatic.  Eyes: EOM are normal.  Neck: Normal range of motion.  Cardiovascular: Normal rate.  Pulmonary/Chest: Effort normal and breath sounds normal.  Abdominal: Soft. She exhibits no distension. There is no tenderness.  Neurological: She is alert.  Skin: Skin is warm.  Psychiatric:  Unable to test  Nursing note and vitals reviewed.    ED Treatments / Results  Labs (all labs ordered are listed, but only abnormal results are displayed) Labs Reviewed - No data to display  EKG None  Radiology Dg Abd 2 Views  Result Date: 07/10/2017 CLINICAL DATA:  Abdominal pain, no bowel movement since Monday, constipation EXAM: ABDOMEN - 2 VIEW COMPARISON:  07/08/2017 FINDINGS: Lung bases clear. Slightly prominent stool in rectum. Remainder of bowel gas pattern appears normal. No bowel wall thickening, evidence of obstruction, or free intraperitoneal air. Osseous structures unremarkable. IMPRESSION: Mildly increased stool in rectum. Electronically Signed   By: Ulyses Southward M.D.   On: 07/10/2017 16:37    Procedures Procedures (including critical care time)  Medications Ordered in ED Medications - No data to display   Initial Impression / Assessment and Plan / ED Course  I have reviewed the triage vital signs and the nursing notes.  Pertinent  labs & imaging results that were available during my care of the patient were reviewed by me and considered in my medical decision making (see chart for details).     Bladder scan demonstrates 56 mL's after urination.  No urinary retention.  X-ray demonstrates stool with otherwise a normal bowel gas pattern.  Recommended daily MiraLAX and ongoing enemas at home.  Primary care referral.  No indication for advanced imaging or additional treatment here.  Stable vital signs.  Final Clinical Impressions(s) / ED Diagnoses   Final diagnoses:  Constipation  Constipation, unspecified constipation type    ED Discharge Orders        Ordered    polyethylene glycol powder (MIRALAX) powder  Daily     07/10/17 1645  Azalia Bilisampos, Ameerah Huffstetler, MD 07/11/17 1023

## 2017-07-16 ENCOUNTER — Emergency Department (HOSPITAL_COMMUNITY)
Admission: EM | Admit: 2017-07-16 | Discharge: 2017-07-16 | Disposition: A | Discharge status: Home / Self Care | Payer: Medicare Other | Attending: Emergency Medicine | Admitting: Emergency Medicine

## 2017-07-16 DIAGNOSIS — R638 Other symptoms and signs concerning food and fluid intake: Secondary | ICD-10-CM | POA: Insufficient documentation

## 2017-07-16 DIAGNOSIS — Z79899 Other long term (current) drug therapy: Secondary | ICD-10-CM | POA: Diagnosis not present

## 2017-07-16 DIAGNOSIS — E860 Dehydration: Secondary | ICD-10-CM | POA: Diagnosis present

## 2017-07-16 MED ORDER — SODIUM CHLORIDE 0.9 % IV BOLUS: 500.0000 mL | Freq: Once | INTRAVENOUS | Status: DC

## 2017-07-16 NOTE — ED Notes (Signed)
Stanford BreedArvella Rasmussen signed for pt as caregiver. V/S were not taken on pt due to pt getting increasingly irritable and caregivers requesting to take her home.

## 2017-07-16 NOTE — Discharge Instructions (Signed)
Be sure to keep her well-hydrated through oral hydration.  Follow-up with the primary care provider on this matter. Should you change your mind regarding labs and imaging, please return to the ED.

## 2017-07-16 NOTE — ED Provider Notes (Signed)
[facility]-EMERGENCY DEPT Provider Note   CSN: [id number] Arrival date & time: 07/16/17  1821     History   Chief Complaint Chief Complaint  Patient presents with  . Dehydration    HPI Megan Rasmussen is a 52 y.o. female.  Level V caveat due to profound MR.  HPI   Megan Rasmussen is a 52 y.o. female, with a history of profound MR, presenting to the ED with decreased oral intake over the past 2 weeks.  Megan Rasmussen, patient's caregiver at the group home, is at the bedside and provides history.  She states patient has been taking in fluids, but has not been eating.  She has baseline difficulty with constipation and last bowel movement was 2 days ago.  They state patient has been evaluated by her PCP.  PCP presented the possibility of dehydration. She has follow up arranged with her PCP for reevaluation and imaging later this week.  Denies fever, LOC, falls/trauma, vomiting, diarrhea, hematochezia/melena, changes in behavior or level of consciousness, or any other abnormalities or complaints.   Past Medical History:  Diagnosis Date  . Acne   . Chronic constipation   . Dysphagia   . Heart disease    rubella  . Insomnia   . Menorrhagia   . Microcephalic (HCC)   . Mitral prolapse   . Mitral regurgitation   . Profoundly mentally retarded   . Psoriasis   . Right flaccid hemiplegia (HCC)   . Rubella   . Seizure (HCC)   . Seizures (HCC)   . Thyroid disease     There are no active problems to display for this patient.   Past Surgical History:  Procedure Laterality Date  . THYROID SURGERY       OB History   None      Home Medications    Prior to Admission medications   Medication Sig Start Date End Date Taking? Authorizing Provider  bisacodyl (DULCOLAX) 5 MG EC tablet Take 5 mg by mouth daily as needed for mild constipation or moderate constipation. For up to 5 days    [provider]  calcitonin, salmon, (MIACALCIN/FORTICAL) 200  UNIT/ACT nasal spray Place 1 spray into the nose daily.    [provider]  calcium-vitamin D (OSCAL WITH D) 500-200 MG-UNIT tablet Take 2 tablets by mouth 2 (two) times daily.    [provider]  coal tar (NEUTROGENA T-GEL) 0.5 % shampoo Apply 1 application topically at bedtime as needed. Use at bedtime to scalp 3 days a week on Monday, Wednesday and Friday    [provider]  diazepam (DIASTAT) 2.5 MG GEL Place 10 mg rectally once. GIVE RECTALLY AS NEEDED FOR SEIZURES LASTING LONGER THAN 3 MINUES OR SEIZURES IN A 24 HOUR PERIOD    [provider]  Emollient (AQUAPHOR EX) Apply 1 application topically daily.    [provider]  folic acid (FOLVITE) 1 MG tablet Take 1 mg by mouth daily.    [provider]  food thickener (THICK IT) POWD Take by mouth as needed (use for nectar thick liquids).    [provider]  HUMIRA PEN 40 MG/0.8ML PNKT Inject 0.8 mLs into the skin every 14 (fourteen) days. 07/02/17   [provider]  lacosamide (VIMPAT) 200 MG TABS tablet Take 200 mg by mouth 2 (two) times daily. For seizures 03/12/16   [provider]  levETIRAcetam (KEPPRA) 100 MG/ML solution Take 100 mg by mouth 3 (three) times  daily.    [provider]  levothyroxine (SYNTHROID, LEVOTHROID) 75 MCG tablet Take 75 mcg by mouth daily before breakfast.    [provider]  mirtazapine (REMERON) 15 MG tablet Take 15 mg by mouth at bedtime.    [provider]  Olopatadine HCl 0.6 % SOLN Place 2 sprays into the nose 2 (two) times daily.    [provider]  PARoxetine (PAXIL) 10 MG tablet Take 10 mg by mouth at bedtime.    [provider]  polyethylene glycol powder (MIRALAX) powder Take 17 g by mouth daily. 07/10/17   Azalia Bilis, MD  promethazine (PHENERGAN) 25 MG tablet Take 1 tablet (25 mg total) by mouth every 8 (eight) hours as needed for nausea or vomiting. Patient not taking: Reported on  07/10/2017 03/15/16   Donnetta Hutching, MD  Valproate Sodium (VALPROIC ACID) 250 MG/5ML SOLN Take 12.5 mLs by mouth 3 (three) times daily.    [provider]  Vitamins A & D (VITAMIN A & D) ointment Apply 1 application topically as needed for dry skin. TO LEFT BUTTOCK WITH [facility] DEPENDS CHANGE    [provider]    Family History No family history on file.  Social History Social History   Tobacco Use  . Smoking status: Never Smoker  Substance Use Topics  . Alcohol use: No  . Drug use: No     Allergies   Amoxicillin-pot clavulanate   Review of Systems Review of Systems  Unable to perform ROS: Patient nonverbal     Physical Exam Updated Vital Signs BP 124/90 (BP Location: Left Arm)   Pulse 80   Temp 97.7 F (36.5 C) (Axillary)   Resp 20   SpO2 100%   Physical Exam  Constitutional: She appears well-developed and well-nourished. No distress.  Patient is quite active moving around in her wheelchair, grabbing onto objects, and playing with water in the sink.   HENT:  Head: Normocephalic and atraumatic.  Mouth/Throat: Oropharynx is clear and moist.  Eyes: Conjunctivae are normal.  Neck: Neck supple.  Cardiovascular: Normal rate, regular rhythm, normal heart sounds and intact distal pulses.  Pulmonary/Chest: Effort normal and breath sounds normal. No respiratory distress.  Abdominal: Soft. Bowel sounds are normal. There is no tenderness. There is no guarding.  Patient has no reaction to palpation of the abdomen.  Musculoskeletal: She exhibits no edema.  Lymphadenopathy:    She has no cervical adenopathy.  Neurological: She is alert.  Patient with profound MR  Skin: Skin is warm and dry. She is not diaphoretic.  Psychiatric: She has a normal mood and affect. Her behavior is normal.  Nursing note and vitals reviewed.    ED Treatments / Results  Labs (all labs ordered are listed, but only abnormal results are displayed) Labs Reviewed  COMPREHENSIVE  METABOLIC PANEL  CBC WITH DIFFERENTIAL/PLATELET  URINALYSIS, ROUTINE W REFLEX MICROSCOPIC    EKG None  Radiology No results found.  Procedures Procedures (including critical care time)  Medications Ordered in ED Medications  sodium chloride 0.9 % bolus 500 mL (has no administration in time range)     Initial Impression / Assessment and Plan / ED Course  I have reviewed the triage vital signs and the nursing notes.  Pertinent labs & imaging results that were available during my care of the patient were reviewed by me and considered in my medical decision making (see chart for details).  Clinical Course as of Jul 17 2335  Tue Jul 16, 2017  2217 RN states when she went into the room to complete the orders for IV, IV fluids, blood work, and urine, the guardian stated they did not want any of that done.   Spoke with guardian, Ms. Janee Morn. She states, "I did not know the work up would be that involved. It's getting late and that sounds really involved. I would rather just have follow up with her PCP."   [SJ]    Clinical Course User Index [SJ] Navin Dogan C, PA-C    Patient presents for evaluation due to concern for poor oral intake.  Per caregiver, patient is still taking in fluids. Patient is nontoxic appearing, afebrile, not tachycardic, not tachypneic, not hypotensive, maintains SPO2 of 100% on room air, and is in no apparent distress.  After the plan for care was presented to the patient's caregiver and she was able to consider the options, she decided to forego any further evaluation here in the ED and continue with it on an outpatient basis. The patient's caregiver was given instructions for home care as well as return precautions. Caregiver voices understanding of these instructions, accepts the plan, and is comfortable with discharge.  Findings and plan of care discussed with Lorre Nick, MD.   Vitals:   07/16/17 1843 07/16/17 2116  BP: (!) 117/104 124/90  Pulse: 85 80    Resp: 18 20  Temp: 97.7 F (36.5 C)   TempSrc: Axillary   SpO2: 100% 100%     Final Clinical Impressions(s) / ED Diagnoses   Final diagnoses:  Decreased oral intake    ED Discharge Orders    None       Concepcion Living 07/16/17 2339    Lorre Nick, MD 07/17/17 [phone]

## 2017-07-16 NOTE — ED Triage Notes (Signed)
Transported from [facility]--recently seen here at this facility for dehydration. Saw PCP and physician notes states: 21 lb weight loss since 07/03/17, ureteral mass, & gastric mass/neoplasm.

## 2017-07-16 NOTE — ED Notes (Signed)
Pt is here with staff from a group home. They stated that the physician saw the pt this am and determined that she needed to be admitted for dehydration/failure to thrive. Pt has lost 20 lbs since last year and 10 lbs since last month. Pts abdomen was palpated and no signs of pain were seen. Pt is moving and expressive. Pt has microcephaly and is non verbal. Pt tried to eat solid food today and vomited right after. She has been drinking better the last few days, however.

## 2017-10-14 DEATH — deceased

## 2019-09-06 IMAGING — CR DG ABDOMEN 2V
2 series · 2 of 2 positions shown · non-contrast
Comparison: 07/08/2017

CLINICAL DATA: Abdominal pain, no bowel movement since [REDACTED],
constipation

EXAM:
ABDOMEN - 2 VIEW

[w abdomen decub]
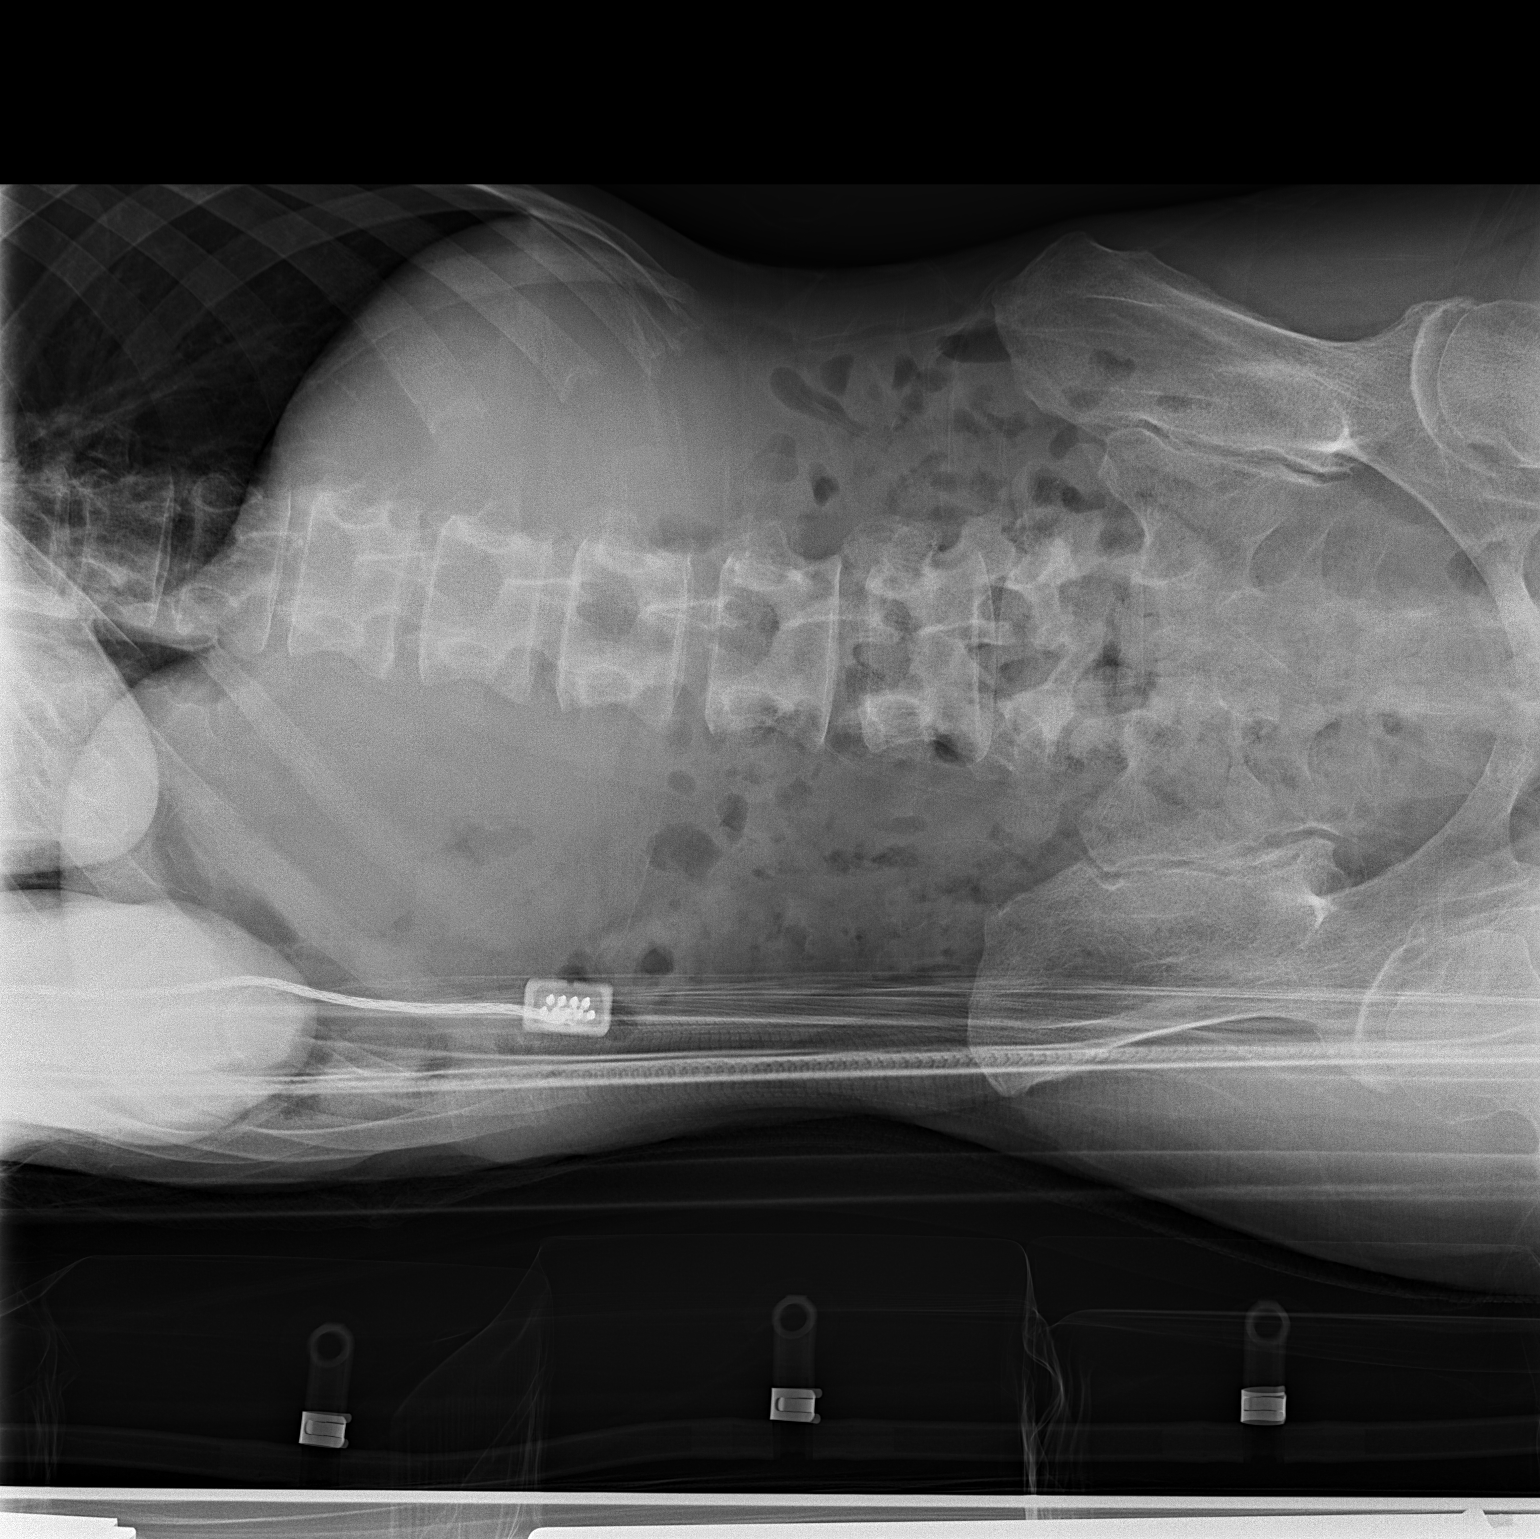

[x abdomen supine]
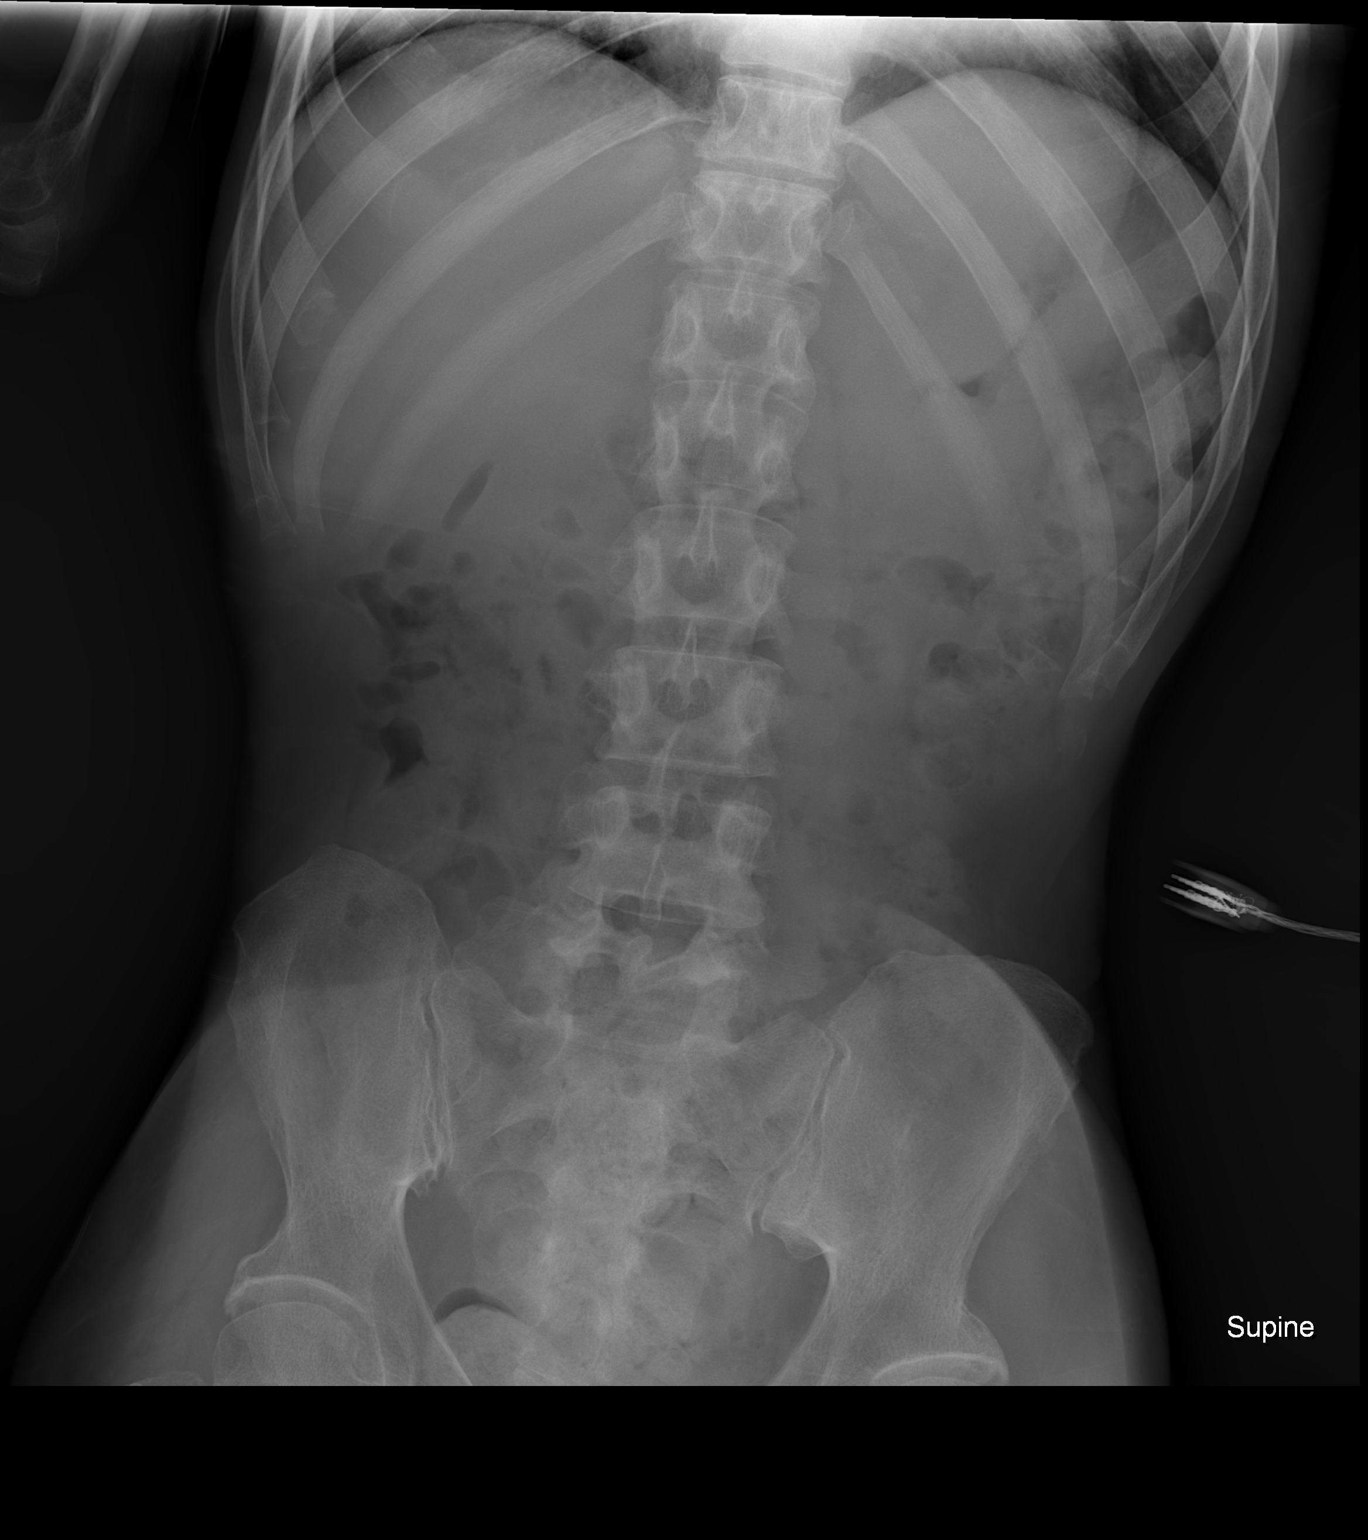

[2 of 2 positions shown; findings below may reference images not displayed]

FINDINGS: Lung bases clear.

Slightly prominent stool in rectum.

Remainder of bowel gas pattern appears normal.

No bowel wall thickening, evidence of obstruction, or free
intraperitoneal air.

Osseous structures unremarkable.
IMPRESSION: Mildly increased stool in rectum.
